# Patient Record
Sex: Male | Born: 1980 | Race: Black or African American | Hispanic: No | Marital: Single | Smoking: Current every day smoker
Health system: Southern US, Community
[De-identification: ages and names within clinical notes are randomized; demographics above are authoritative.]

## PROBLEM LIST (undated history)

## (undated) DIAGNOSIS — J45909 Unspecified asthma, uncomplicated: Secondary | ICD-10-CM

---
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
## 2007-03-26 ENCOUNTER — Emergency Department (HOSPITAL_COMMUNITY): Admission: EM | Admit: 2007-03-26 | Discharge: 2007-03-26 | Payer: Self-pay | Admitting: Emergency Medicine

## 2007-10-23 ENCOUNTER — Emergency Department (HOSPITAL_COMMUNITY): Admission: EM | Admit: 2007-10-23 | Discharge: 2007-10-23 | Payer: Self-pay | Admitting: Family Medicine

## 2009-12-29 ENCOUNTER — Emergency Department (HOSPITAL_COMMUNITY): Admission: EM | Admit: 2009-12-29 | Discharge: 2009-12-29 | Payer: Self-pay | Admitting: Family Medicine

## 2010-10-17 LAB — GC/CHLAMYDIA PROBE AMP, GENITAL
Chlamydia, DNA Probe: POSITIVE — AB
GC Probe Amp, Genital: POSITIVE — AB

## 2011-04-13 ENCOUNTER — Emergency Department (HOSPITAL_BASED_OUTPATIENT_CLINIC_OR_DEPARTMENT_OTHER)
Admission: EM | Admit: 2011-04-13 | Discharge: 2011-04-14 | Disposition: A | Discharge status: Home / Self Care | Payer: No Typology Code available for payment source | Attending: Emergency Medicine | Admitting: Emergency Medicine

## 2011-04-13 ENCOUNTER — Encounter: Payer: Self-pay | Admitting: *Deleted

## 2011-04-13 DIAGNOSIS — IMO0002 Reserved for concepts with insufficient information to code with codable children: Secondary | ICD-10-CM | POA: Insufficient documentation

## 2011-04-13 DIAGNOSIS — Y92410 Unspecified street and highway as the place of occurrence of the external cause: Secondary | ICD-10-CM | POA: Insufficient documentation

## 2011-04-13 DIAGNOSIS — F17200 Nicotine dependence, unspecified, uncomplicated: Secondary | ICD-10-CM | POA: Insufficient documentation

## 2011-04-13 DIAGNOSIS — S39012A Strain of muscle, fascia and tendon of lower back, initial encounter: Secondary | ICD-10-CM
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
## 2011-04-14 MED ORDER — HYDROCODONE-ACETAMINOPHEN 5-325 MG PO TABS: 1.0000 | ORAL_TABLET | Freq: Four times a day (QID) | ORAL | Status: AC | PRN

## 2011-04-14 MED ORDER — HYDROCODONE-ACETAMINOPHEN 5-325 MG PO TABS
1.0000 | ORAL_TABLET | Freq: Once | ORAL | Status: AC
  Administered 2011-04-14: 1 via ORAL
  Filled 2011-04-14: qty 1

## 2013-03-01 ENCOUNTER — Emergency Department (HOSPITAL_COMMUNITY): Payer: Self-pay

## 2013-03-01 ENCOUNTER — Encounter (HOSPITAL_COMMUNITY): Payer: Self-pay | Admitting: Emergency Medicine

## 2013-03-01 ENCOUNTER — Emergency Department (HOSPITAL_COMMUNITY)
Admission: EM | Admit: 2013-03-01 | Discharge: 2013-03-01 | Disposition: A | Discharge status: Home / Self Care | Payer: Self-pay | Attending: Emergency Medicine | Admitting: Emergency Medicine

## 2013-03-01 DIAGNOSIS — J45909 Unspecified asthma, uncomplicated: Secondary | ICD-10-CM | POA: Insufficient documentation

## 2013-03-01 DIAGNOSIS — F17200 Nicotine dependence, unspecified, uncomplicated: Secondary | ICD-10-CM | POA: Insufficient documentation

## 2013-03-01 DIAGNOSIS — W108XXA Fall (on) (from) other stairs and steps, initial encounter: Secondary | ICD-10-CM | POA: Insufficient documentation

## 2013-03-01 DIAGNOSIS — S93401A Sprain of unspecified ligament of right ankle, initial encounter: Secondary | ICD-10-CM

## 2013-03-01 DIAGNOSIS — Y92009 Unspecified place in unspecified non-institutional (private) residence as the place of occurrence of the external cause: Secondary | ICD-10-CM | POA: Insufficient documentation

## 2013-03-01 DIAGNOSIS — S93409A Sprain of unspecified ligament of unspecified ankle, initial encounter: Secondary | ICD-10-CM | POA: Insufficient documentation

## 2013-03-01 DIAGNOSIS — Y939 Activity, unspecified: Secondary | ICD-10-CM | POA: Insufficient documentation

## 2013-03-01 HISTORY — DX: Unspecified asthma, uncomplicated: J45.909

## 2013-03-01 MED ORDER — IBUPROFEN 200 MG PO TABS
600.0000 mg | ORAL_TABLET | Freq: Once | ORAL | Status: DC
  Filled 2013-03-01: qty 3

## 2013-03-01 NOTE — Progress Notes (Signed)
Orthopedic Tech Progress Note Patient Details:  Zachary Hart [birth year] [id number] CAM Walker applied Ortho Devices Type of Ortho Device: CAM walker Ortho Device/Splint Interventions: Application   Asia R Thompson 03/01/2013, 12:44 PM

## 2013-03-01 NOTE — ED Notes (Signed)
Patient returned from X-ray 

## 2013-03-01 NOTE — ED Provider Notes (Signed)
Medical screening examination/treatment/procedure(s) were performed by non-physician practitioner and as supervising physician I was immediately available for consultation/collaboration.   Charles B. Bernette Mayers, MD 03/01/13 [phone]

## 2013-03-01 NOTE — ED Provider Notes (Signed)
CSN: [id number]     Arrival date & time 03/01/13  4098 History     First MD Initiated Contact with Patient 03/01/13 [phone]     Chief Complaint  Patient presents with  . Fall  . Leg Injury   (Consider location/radiation/quality/duration/timing/severity/associated sxs/prior Treatment) HPI Comments: 32 year old male presents the emergency department complaining of right ankle pain after falling down the steps outside his house this morning after tripping about one or prior to arrival. Denies hitting his head or loss of consciousness. Describes the pain as sharp and achy, rated 7/10. He has not had any alleviating factors. Pain exacerbated by pressure or walking. Denies swelling, bruising, numbness or tingling. Also states he missed a step about 3-4 weeks ago and twisted his L ankle and is still having mild pain in it. He did not seek treatment.  Patient is a 32 y.o. male presenting with fall. The history is provided by the patient.  Fall Pertinent negatives include no joint swelling.    Past Medical History  Diagnosis Date  . Asthma    History reviewed. No pertinent past surgical history. No family history on file. History  Substance Use Topics  . Smoking status: Current Every Day Smoker -- 0.50 packs/day  . Smokeless tobacco: Not on file  . Alcohol Use: Yes     Comment: occasional    Review of Systems  Musculoskeletal: Negative for joint swelling.       Positive for R ankle pain.  Skin: Negative for color change.  All other systems reviewed and are negative.    Allergies  Percocet  Home Medications  No current outpatient prescriptions on file. BP 132/75  Pulse 71  Temp(Src) 98.1 F (36.7 C) (Oral)  Resp 18  Ht 5\' 6"  (1.676 m)  Wt 180 lb (81.647 kg)  BMI 29.07 kg/m2  SpO2 98% Physical Exam  Nursing note and vitals reviewed. Constitutional: He is oriented to person, place, and time. He appears well-developed and well-nourished. No distress.  HENT:  Head:  Normocephalic and atraumatic.  Mouth/Throat: Oropharynx is clear and moist.  Eyes: Conjunctivae are normal.  Neck: Normal range of motion. Neck supple.  Cardiovascular: Normal rate, regular rhythm, normal heart sounds and intact distal pulses.   Pulses:      Dorsalis pedis pulses are 2+ on the right side, and 2+ on the left side.       Posterior tibial pulses are 2+ on the right side, and 2+ on the left side.  Pulmonary/Chest: Effort normal and breath sounds normal.  Musculoskeletal:  TTP of lateral aspect of R ankle. No edema, deformity, bruising. Full ROM, pain reported in all directions. Achilles tendon intact. L ankle non-tender, full ROM, no edema, deformity, bruising.  Neurological: He is alert and oriented to person, place, and time. No sensory deficit.  Skin: Skin is warm and dry. He is not diaphoretic.  Psychiatric: His affect is blunt.    ED Course   Procedures (including critical care time)  Labs Reviewed - No data to display Dg Ankle Complete Right  03/01/2013   *RADIOLOGY REPORT*  Clinical Data: Fall, right ankle pain.  RIGHT ANKLE - COMPLETE 3+ VIEW  Comparison: None.  Findings: There is irregularity noted in the distal tibia medially. This could represent an osteochondral injury.  No bowel other acute bony abnormalities seen.  Soft tissues are intact.  IMPRESSION: Question osteochondral injury in the medial distal tibia along the medial ankle mortise.  Consider further evaluation with CT if felt clinically  indicated.   Original Report Authenticated By: Charlett Nose, M.D.   1. Ankle sprain, right, initial encounter     MDM  Patient with ankle sprain/questionable osteochondral injury. He is in NAD and able to ambulate, with a limp. Discharge home with cam walker. Ibuprofen for pain. F/u with ortho.  Trevor Mace, PA-C 03/01/13 [phone]

## 2013-03-01 NOTE — ED Notes (Signed)
Patient transported to X-ray 

## 2013-03-01 NOTE — ED Notes (Signed)
Pt fell down his outside steps this morning, pt not sure how he fell. Pt c/o right ankle pain. Pt also fell about 3-4 weeks ago at someone elses house by missing a step, Pt continues to have pain to left ankle.

## 2013-04-27 ENCOUNTER — Inpatient Hospital Stay (HOSPITAL_COMMUNITY)
Admission: EM | Admit: 2013-04-27 | Discharge: 2013-04-29 | DRG: 471 | Disposition: A | Discharge status: Home / Self Care | Payer: Self-pay | Attending: Neurological Surgery | Admitting: Neurological Surgery

## 2013-04-27 ENCOUNTER — Encounter (HOSPITAL_COMMUNITY): Payer: Self-pay | Admitting: Emergency Medicine

## 2013-04-27 DIAGNOSIS — S13151A Dislocation of C4/C5 cervical vertebrae, initial encounter: Secondary | ICD-10-CM | POA: Diagnosis present

## 2013-04-27 DIAGNOSIS — Y9389 Activity, other specified: Secondary | ICD-10-CM

## 2013-04-27 DIAGNOSIS — F17200 Nicotine dependence, unspecified, uncomplicated: Secondary | ICD-10-CM | POA: Diagnosis present

## 2013-04-27 DIAGNOSIS — M5000 Cervical disc disorder with myelopathy, unspecified cervical region: Secondary | ICD-10-CM | POA: Diagnosis present

## 2013-04-27 DIAGNOSIS — W1809XA Striking against other object with subsequent fall, initial encounter: Secondary | ICD-10-CM | POA: Diagnosis present

## 2013-04-27 DIAGNOSIS — S14125A Central cord syndrome at C5 level of cervical spinal cord, initial encounter: Secondary | ICD-10-CM

## 2013-04-27 DIAGNOSIS — S14121A Central cord syndrome at C1 level of cervical spinal cord, initial encounter: Secondary | ICD-10-CM | POA: Diagnosis present

## 2013-04-27 DIAGNOSIS — M4712 Other spondylosis with myelopathy, cervical region: Principal | ICD-10-CM | POA: Diagnosis present

## 2013-04-27 DIAGNOSIS — J45909 Unspecified asthma, uncomplicated: Secondary | ICD-10-CM | POA: Diagnosis present

## 2013-04-27 DIAGNOSIS — IMO0002 Reserved for concepts with insufficient information to code with codable children: Secondary | ICD-10-CM

## 2013-04-27 NOTE — ED Notes (Signed)
Per Manus Rudd PA pt placed in rigid aspen collar

## 2013-04-27 NOTE — ED Provider Notes (Signed)
11:54 PM discussed with radiologist, will call in an MRI team. Patient is tender lower C-spine and upper T-spine. Radiologist recommends order MR C-spine which will cover the upper T-spine  Discussed with trauma surgeon on-call, Dr. Magnus Ivan, given isolated injury and occurred yesterday he does not feel this requires level I trauma activation.  Aspen c-collar in place. On exam has weak grip strengths, biceps and triceps strengths with sensorium to light touch intact throughout upper extremities and subjective decreased sensation.  No neuro deficits otherwise.  1:37 AM preliminary MR results discussed with radiologist, has hematoma/cord contusion to C4-5 down to C6. Neurosurgery consulted.   2:34 AM d/w DR Danielle Dess, will admit Neuro ICU. IV decadron.  Sunnie Nielsen, MD 04/28/13 [phone]

## 2013-04-27 NOTE — ED Notes (Signed)
Pt states numbness to L hand and L back. Soreness to R ankle. Pt has weak grip to L hand.
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
## 2013-04-28 ENCOUNTER — Encounter (HOSPITAL_COMMUNITY): Payer: Self-pay | Admitting: Radiology

## 2013-04-28 ENCOUNTER — Encounter (HOSPITAL_COMMUNITY)
Admission: EM | Disposition: A | Discharge status: Home / Self Care | Payer: Self-pay | Source: Home / Self Care | Attending: Neurological Surgery

## 2013-04-28 ENCOUNTER — Emergency Department (HOSPITAL_COMMUNITY): Payer: Self-pay

## 2013-04-28 ENCOUNTER — Inpatient Hospital Stay (HOSPITAL_COMMUNITY): Payer: No Typology Code available for payment source

## 2013-04-28 ENCOUNTER — Inpatient Hospital Stay (HOSPITAL_COMMUNITY): Payer: Self-pay | Admitting: Certified Registered"

## 2013-04-28 ENCOUNTER — Encounter (HOSPITAL_COMMUNITY): Payer: Self-pay | Admitting: Certified Registered"

## 2013-04-28 HISTORY — PX: ANTERIOR CERVICAL DECOMP/DISCECTOMY FUSION: SHX1161

## 2013-04-28 LAB — MRSA PCR SCREENING: MRSA by PCR: NEGATIVE

## 2013-04-28 LAB — POCT I-STAT, CHEM 8
BUN: 10 mg/dL (ref 6–23)
Calcium, Ion: 1.16 mmol/L (ref 1.12–1.23)
Creatinine, Ser: 1.4 mg/dL — ABNORMAL HIGH (ref 0.50–1.35)
Glucose, Bld: 95 mg/dL (ref 70–99)
Hemoglobin: 15 g/dL (ref 13.0–17.0)
Sodium: 141 mEq/L (ref 135–145)
TCO2: 22 mmol/L (ref 0–100)

## 2013-04-28 LAB — CBC
HCT: 40.2 % (ref 39.0–52.0)
Hemoglobin: 13.5 g/dL (ref 13.0–17.0)
MCH: 30.2 pg (ref 26.0–34.0)
MCHC: 33.6 g/dL (ref 30.0–36.0)
MCV: 89.9 fL (ref 78.0–100.0)
RBC: 4.47 MIL/uL (ref 4.22–5.81)

## 2013-04-28 SURGERY — ANTERIOR CERVICAL DECOMPRESSION/DISCECTOMY FUSION 1 LEVEL
Anesthesia: General | Site: Neck | Wound class: Clean

## 2013-04-28 MED ORDER — ONDANSETRON HCL 4 MG/2ML IJ SOLN
4.0000 mg | Freq: Once | INTRAMUSCULAR | Status: AC
  Administered 2013-04-28: 4 mg via INTRAVENOUS
  Filled 2013-04-28: qty 2

## 2013-04-28 MED ORDER — LACTATED RINGERS IV SOLN
INTRAVENOUS | Status: DC | PRN
  Administered 2013-04-28 (×2): via INTRAVENOUS

## 2013-04-28 MED ORDER — LIDOCAINE HCL (CARDIAC) 20 MG/ML IV SOLN
INTRAVENOUS | Status: DC | PRN
  Administered 2013-04-28: 40 mg via INTRAVENOUS

## 2013-04-28 MED ORDER — SODIUM CHLORIDE 0.9 % IV SOLN: 250.0000 mL | INTRAVENOUS | Status: DC

## 2013-04-28 MED ORDER — FENTANYL CITRATE 0.05 MG/ML IJ SOLN
INTRAMUSCULAR | Status: DC | PRN
  Administered 2013-04-28: 50 ug via INTRAVENOUS
  Administered 2013-04-28 (×2): 100 ug via INTRAVENOUS
  Administered 2013-04-28: 50 ug via INTRAVENOUS
  Administered 2013-04-28: 150 ug via INTRAVENOUS

## 2013-04-28 MED ORDER — MIDAZOLAM HCL 2 MG/2ML IJ SOLN: 0.5000 mg | Freq: Once | INTRAMUSCULAR | Status: DC | PRN

## 2013-04-28 MED ORDER — DOCUSATE SODIUM 100 MG PO CAPS
100.0000 mg | ORAL_CAPSULE | Freq: Two times a day (BID) | ORAL | Status: DC
  Administered 2013-04-28: 100 mg via ORAL
  Filled 2013-04-28 (×2): qty 1

## 2013-04-28 MED ORDER — DEXAMETHASONE SODIUM PHOSPHATE 4 MG/ML IJ SOLN
10.0000 mg | Freq: Once | INTRAMUSCULAR | Status: AC
  Administered 2013-04-28: 10 mg via INTRAVENOUS
  Filled 2013-04-28: qty 3

## 2013-04-28 MED ORDER — ACETAMINOPHEN 650 MG RE SUPP: 650.0000 mg | RECTAL | Status: DC | PRN

## 2013-04-28 MED ORDER — ACETAMINOPHEN 325 MG PO TABS: 650.0000 mg | ORAL_TABLET | ORAL | Status: DC | PRN

## 2013-04-28 MED ORDER — POLYETHYLENE GLYCOL 3350 17 G PO PACK
17.0000 g | PACK | Freq: Every day | ORAL | Status: DC | PRN
  Filled 2013-04-28: qty 1

## 2013-04-28 MED ORDER — CEFAZOLIN SODIUM-DEXTROSE 2-3 GM-% IV SOLR
2.0000 g | Freq: Once | INTRAVENOUS | Status: DC
  Administered 2013-04-28: 2 g via INTRAVENOUS

## 2013-04-28 MED ORDER — ONDANSETRON HCL 4 MG/2ML IJ SOLN: 4.0000 mg | Freq: Four times a day (QID) | INTRAMUSCULAR | Status: DC | PRN

## 2013-04-28 MED ORDER — OXYCODONE HCL 5 MG PO TABS: 5.0000 mg | ORAL_TABLET | Freq: Once | ORAL | Status: DC | PRN

## 2013-04-28 MED ORDER — ALBUTEROL SULFATE HFA 108 (90 BASE) MCG/ACT IN AERS
INHALATION_SPRAY | RESPIRATORY_TRACT | Status: DC | PRN
  Administered 2013-04-28: 2 via RESPIRATORY_TRACT

## 2013-04-28 MED ORDER — ONDANSETRON HCL 4 MG/2ML IJ SOLN
INTRAMUSCULAR | Status: DC | PRN
  Administered 2013-04-28: 4 mg via INTRAVENOUS

## 2013-04-28 MED ORDER — PHENOL 1.4 % MT LIQD: 1.0000 | OROMUCOSAL | Status: DC | PRN

## 2013-04-28 MED ORDER — BISACODYL 10 MG RE SUPP: 10.0000 mg | Freq: Every day | RECTAL | Status: DC | PRN

## 2013-04-28 MED ORDER — FENTANYL CITRATE 0.05 MG/ML IJ SOLN
50.0000 ug | INTRAMUSCULAR | Status: DC | PRN
  Administered 2013-04-28: 50 ug via INTRAVENOUS
  Filled 2013-04-28: qty 2

## 2013-04-28 MED ORDER — ONDANSETRON HCL 4 MG/2ML IJ SOLN: 4.0000 mg | INTRAMUSCULAR | Status: DC | PRN

## 2013-04-28 MED ORDER — FLEET ENEMA 7-19 GM/118ML RE ENEM: 1.0000 | ENEMA | Freq: Once | RECTAL | Status: AC | PRN

## 2013-04-28 MED ORDER — MORPHINE SULFATE 2 MG/ML IJ SOLN
1.0000 mg | INTRAMUSCULAR | Status: DC | PRN
  Administered 2013-04-29: 4 mg via INTRAVENOUS
  Administered 2013-04-29: 2 mg via INTRAVENOUS
  Filled 2013-04-28: qty 2
  Filled 2013-04-28: qty 1

## 2013-04-28 MED ORDER — MIDAZOLAM HCL 5 MG/5ML IJ SOLN
INTRAMUSCULAR | Status: DC | PRN
  Administered 2013-04-28: 1 mg via INTRAVENOUS

## 2013-04-28 MED ORDER — SENNA 8.6 MG PO TABS
1.0000 | ORAL_TABLET | Freq: Two times a day (BID) | ORAL | Status: DC
Start: 2013-04-28 — End: 2013-04-29
  Administered 2013-04-28: 8.6 mg via ORAL
  Filled 2013-04-28 (×2): qty 1

## 2013-04-28 MED ORDER — 0.9 % SODIUM CHLORIDE (POUR BTL) OPTIME
TOPICAL | Status: DC | PRN
  Administered 2013-04-28: 1000 mL

## 2013-04-28 MED ORDER — SODIUM CHLORIDE 0.9 % IJ SOLN
3.0000 mL | Freq: Two times a day (BID) | INTRAMUSCULAR | Status: DC
  Administered 2013-04-28: 3 mL via INTRAVENOUS

## 2013-04-28 MED ORDER — PROPOFOL 10 MG/ML IV BOLUS
INTRAVENOUS | Status: DC | PRN
  Administered 2013-04-28: 20 mg via INTRAVENOUS
  Administered 2013-04-28: 200 mg via INTRAVENOUS

## 2013-04-28 MED ORDER — LIDOCAINE-EPINEPHRINE 1 %-1:100000 IJ SOLN
INTRAMUSCULAR | Status: DC | PRN
  Administered 2013-04-28: 5 mL

## 2013-04-28 MED ORDER — PHENYLEPHRINE HCL 10 MG/ML IJ SOLN
INTRAMUSCULAR | Status: DC | PRN
  Administered 2013-04-28: 40 ug via INTRAVENOUS
  Administered 2013-04-28: 80 ug via INTRAVENOUS

## 2013-04-28 MED ORDER — PROMETHAZINE HCL 25 MG/ML IJ SOLN: 6.2500 mg | INTRAMUSCULAR | Status: DC | PRN

## 2013-04-28 MED ORDER — MENTHOL 3 MG MT LOZG: 1.0000 | LOZENGE | OROMUCOSAL | Status: DC | PRN

## 2013-04-28 MED ORDER — SODIUM CHLORIDE 0.9 % IJ SOLN
3.0000 mL | Freq: Two times a day (BID) | INTRAMUSCULAR | Status: DC
  Administered 2013-04-28 – 2013-04-29 (×2): 3 mL via INTRAVENOUS

## 2013-04-28 MED ORDER — HEMOSTATIC AGENTS (NO CHARGE) OPTIME
TOPICAL | Status: DC | PRN
  Administered 2013-04-28: 1 via TOPICAL

## 2013-04-28 MED ORDER — THROMBIN 5000 UNITS EX SOLR
CUTANEOUS | Status: DC | PRN
  Administered 2013-04-28 (×2): 5000 [IU] via TOPICAL

## 2013-04-28 MED ORDER — HYDROMORPHONE HCL PF 1 MG/ML IJ SOLN: 0.2500 mg | INTRAMUSCULAR | Status: DC | PRN

## 2013-04-28 MED ORDER — METHOCARBAMOL 100 MG/ML IJ SOLN
500.0000 mg | Freq: Four times a day (QID) | INTRAMUSCULAR | Status: DC | PRN
  Filled 2013-04-28: qty 5

## 2013-04-28 MED ORDER — CEFAZOLIN SODIUM-DEXTROSE 2-3 GM-% IV SOLR
INTRAVENOUS | Status: AC
  Administered 2013-04-28: 2 g via INTRAVENOUS
  Filled 2013-04-28: qty 50

## 2013-04-28 MED ORDER — DEXAMETHASONE SODIUM PHOSPHATE 4 MG/ML IJ SOLN
INTRAMUSCULAR | Status: DC | PRN
  Administered 2013-04-28: 10 mg via INTRAVENOUS

## 2013-04-28 MED ORDER — ALUM & MAG HYDROXIDE-SIMETH 200-200-20 MG/5ML PO SUSP: 30.0000 mL | Freq: Four times a day (QID) | ORAL | Status: DC | PRN

## 2013-04-28 MED ORDER — DOCUSATE SODIUM 100 MG PO CAPS
100.0000 mg | ORAL_CAPSULE | Freq: Two times a day (BID) | ORAL | Status: DC
  Filled 2013-04-28 (×2): qty 1

## 2013-04-28 MED ORDER — NEOSTIGMINE METHYLSULFATE 1 MG/ML IJ SOLN
INTRAMUSCULAR | Status: DC | PRN
  Administered 2013-04-28: 4 mg via INTRAVENOUS

## 2013-04-28 MED ORDER — MEPERIDINE HCL 25 MG/ML IJ SOLN: 6.2500 mg | INTRAMUSCULAR | Status: DC | PRN

## 2013-04-28 MED ORDER — SODIUM CHLORIDE 0.9 % IR SOLN
Status: DC | PRN
  Administered 2013-04-28: 15:00:00

## 2013-04-28 MED ORDER — DEXTROSE 5 % IV SOLN
INTRAVENOUS | Status: DC | PRN
  Administered 2013-04-28: 14:00:00 via INTRAVENOUS

## 2013-04-28 MED ORDER — OXYCODONE HCL 5 MG/5ML PO SOLN: 5.0000 mg | Freq: Once | ORAL | Status: DC | PRN

## 2013-04-28 MED ORDER — POTASSIUM CHLORIDE IN NACL 20-0.9 MEQ/L-% IV SOLN
INTRAVENOUS | Status: DC
  Administered 2013-04-28: 04:00:00 via INTRAVENOUS
  Filled 2013-04-28 (×6): qty 1000

## 2013-04-28 MED ORDER — MAGNESIUM CITRATE PO SOLN
1.0000 | Freq: Once | ORAL | Status: AC | PRN
  Filled 2013-04-28: qty 296

## 2013-04-28 MED ORDER — HYDROMORPHONE HCL PF 1 MG/ML IJ SOLN
0.5000 mg | INTRAMUSCULAR | Status: DC | PRN
  Administered 2013-04-28 (×3): 0.5 mg via INTRAVENOUS
  Filled 2013-04-28 (×3): qty 1

## 2013-04-28 MED ORDER — ONDANSETRON HCL 4 MG PO TABS: 4.0000 mg | ORAL_TABLET | Freq: Four times a day (QID) | ORAL | Status: DC | PRN

## 2013-04-28 MED ORDER — ROCURONIUM BROMIDE 100 MG/10ML IV SOLN
INTRAVENOUS | Status: DC | PRN
  Administered 2013-04-28: 50 mg via INTRAVENOUS

## 2013-04-28 MED ORDER — BUPIVACAINE HCL (PF) 0.5 % IJ SOLN
INTRAMUSCULAR | Status: DC | PRN
  Administered 2013-04-28: 5 mL

## 2013-04-28 MED ORDER — GLYCOPYRROLATE 0.2 MG/ML IJ SOLN
INTRAMUSCULAR | Status: DC | PRN
  Administered 2013-04-28: 0.6 mg via INTRAVENOUS

## 2013-04-28 MED ORDER — SODIUM CHLORIDE 0.9 % IJ SOLN: 3.0000 mL | INTRAMUSCULAR | Status: DC | PRN

## 2013-04-28 MED ORDER — DEXAMETHASONE SODIUM PHOSPHATE 4 MG/ML IJ SOLN
8.0000 mg | Freq: Four times a day (QID) | INTRAMUSCULAR | Status: DC
  Administered 2013-04-28 – 2013-04-29 (×7): 8 mg via INTRAVENOUS
  Filled 2013-04-28 (×14): qty 2

## 2013-04-28 MED ORDER — SENNA 8.6 MG PO TABS
1.0000 | ORAL_TABLET | Freq: Two times a day (BID) | ORAL | Status: DC
  Filled 2013-04-28 (×2): qty 1

## 2013-04-28 MED ORDER — METHOCARBAMOL 500 MG PO TABS
500.0000 mg | ORAL_TABLET | Freq: Four times a day (QID) | ORAL | Status: DC | PRN
  Administered 2013-04-28: 500 mg via ORAL
  Filled 2013-04-28: qty 1

## 2013-04-28 SURGICAL SUPPLY — 56 items
BAG DECANTER FOR FLEXI CONT (MISCELLANEOUS) ×2 IMPLANT
BANDAGE GAUZE ELAST BULKY 4 IN (GAUZE/BANDAGES/DRESSINGS) IMPLANT
BIT DRILL 14MM (INSTRUMENTS) ×1 IMPLANT
BIT DRILL NEURO 2X3.1 SFT TUCH (MISCELLANEOUS) ×1 IMPLANT
BUR BARREL STRAIGHT FLUTE 4.0 (BURR) ×2 IMPLANT
CANISTER SUCTION 2500CC (MISCELLANEOUS) ×2 IMPLANT
CLOTH BEACON ORANGE TIMEOUT ST (SAFETY) ×2 IMPLANT
CONT SPEC 4OZ CLIKSEAL STRL BL (MISCELLANEOUS) ×4 IMPLANT
DECANTER SPIKE VIAL GLASS SM (MISCELLANEOUS) ×2 IMPLANT
DERMABOND ADHESIVE PROPEN (GAUZE/BANDAGES/DRESSINGS) ×1
DERMABOND ADVANCED (GAUZE/BANDAGES/DRESSINGS) ×1
DERMABOND ADVANCED .7 DNX12 (GAUZE/BANDAGES/DRESSINGS) ×1 IMPLANT
DERMABOND ADVANCED .7 DNX6 (GAUZE/BANDAGES/DRESSINGS) ×1 IMPLANT
DRAPE LAPAROTOMY 100X72 PEDS (DRAPES) ×2 IMPLANT
DRAPE MICROSCOPE LEICA (MISCELLANEOUS) IMPLANT
DRAPE POUCH INSTRU U-SHP 10X18 (DRAPES) ×2 IMPLANT
DRESSING TELFA 8X3 (GAUZE/BANDAGES/DRESSINGS) ×2 IMPLANT
DRILL 14MM (INSTRUMENTS) ×2
DRILL NEURO 2X3.1 SOFT TOUCH (MISCELLANEOUS) ×2
DRSG OPSITE 4X5.5 SM (GAUZE/BANDAGES/DRESSINGS) ×2 IMPLANT
DURAPREP 6ML APPLICATOR 50/CS (WOUND CARE) ×2 IMPLANT
ELECT REM PT RETURN 9FT ADLT (ELECTROSURGICAL) ×2
ELECTRODE REM PT RTRN 9FT ADLT (ELECTROSURGICAL) ×1 IMPLANT
GAUZE SPONGE 4X4 16PLY XRAY LF (GAUZE/BANDAGES/DRESSINGS) IMPLANT
GLOVE BIO SURGEON STRL SZ7.5 (GLOVE) IMPLANT
GLOVE BIOGEL PI IND STRL 7.5 (GLOVE) IMPLANT
GLOVE BIOGEL PI IND STRL 8.5 (GLOVE) ×1 IMPLANT
GLOVE BIOGEL PI INDICATOR 7.5 (GLOVE)
GLOVE BIOGEL PI INDICATOR 8.5 (GLOVE) ×1
GLOVE ECLIPSE 8.5 STRL (GLOVE) ×2 IMPLANT
GLOVE EXAM NITRILE LRG STRL (GLOVE) IMPLANT
GLOVE EXAM NITRILE MD LF STRL (GLOVE) IMPLANT
GLOVE EXAM NITRILE XL STR (GLOVE) IMPLANT
GLOVE EXAM NITRILE XS STR PU (GLOVE) IMPLANT
GOWN BRE IMP SLV AUR LG STRL (GOWN DISPOSABLE) IMPLANT
GOWN BRE IMP SLV AUR XL STRL (GOWN DISPOSABLE) ×2 IMPLANT
GOWN STRL REIN 2XL LVL4 (GOWN DISPOSABLE) ×2 IMPLANT
HEAD HALTER (SOFTGOODS) ×2 IMPLANT
KIT BASIN OR (CUSTOM PROCEDURE TRAY) ×2 IMPLANT
KIT ROOM TURNOVER OR (KITS) ×2 IMPLANT
NEEDLE HYPO 22GX1.5 SAFETY (NEEDLE) ×2 IMPLANT
NEEDLE SPNL 22GX3.5 QUINCKE BK (NEEDLE) ×2 IMPLANT
NS IRRIG 1000ML POUR BTL (IV SOLUTION) ×2 IMPLANT
PACK LAMINECTOMY NEURO (CUSTOM PROCEDURE TRAY) ×2 IMPLANT
PAD ARMBOARD 7.5X6 YLW CONV (MISCELLANEOUS) ×6 IMPLANT
PLATE TRESTLE LUXE 12 1LVL (Plate) ×2 IMPLANT
RUBBERBAND STERILE (MISCELLANEOUS) IMPLANT
SCREW 14MM (Screw) ×2 IMPLANT
SPACER ASSEM CERV LORD 6M (Spacer) ×2 IMPLANT
SPONGE INTESTINAL PEANUT (DISPOSABLE) ×2 IMPLANT
SPONGE SURGIFOAM ABS GEL SZ50 (HEMOSTASIS) ×2 IMPLANT
SUT VIC AB 3-0 SH 8-18 (SUTURE) ×2 IMPLANT
SYR 20ML ECCENTRIC (SYRINGE) ×2 IMPLANT
TOWEL OR 17X24 6PK STRL BLUE (TOWEL DISPOSABLE) ×2 IMPLANT
TOWEL OR 17X26 10 PK STRL BLUE (TOWEL DISPOSABLE) ×2 IMPLANT
WATER STERILE IRR 1000ML POUR (IV SOLUTION) ×2 IMPLANT

## 2013-04-28 NOTE — ED Provider Notes (Signed)
Medical screening examination/treatment/procedure(s) were conducted as a shared visit with non-physician practitioner(s) and myself.  I personally evaluated the patient during the encounter  Sunnie Nielsen, MD 04/28/13 [phone]

## 2013-04-28 NOTE — Anesthesia Procedure Notes (Signed)
Procedure Name: Intubation Date/Time: 04/28/2013 2:18 PM Performed by: Darcey Nora B Pre-anesthesia Checklist: Patient identified, Patient being monitored, Emergency Drugs available and Suction available Patient Re-evaluated:Patient Re-evaluated prior to inductionOxygen Delivery Method: Circle system utilized Preoxygenation: Pre-oxygenation with 100% oxygen Intubation Type: IV induction Ventilation: Mask ventilation without difficulty Laryngoscope Size: Mac and 4 ((Glidescope)) Grade View: Grade II Tube type: Oral Tube size: 8.0 mm Number of attempts: 1 Airway Equipment and Method: Video-laryngoscopy and Stylet Placement Confirmation: ETT inserted through vocal cords under direct vision,  breath sounds checked- equal and bilateral and positive ETCO2 Secured at: 24 (cm at teeth) cm Tube secured with: Tape Dental Injury: Teeth and Oropharynx as per pre-operative assessment

## 2013-04-28 NOTE — ED Provider Notes (Signed)
Medical screening examination/treatment/procedure(s) were conducted as a shared visit with non-physician practitioner(s) and myself.  I personally evaluated the patient during the encounter  CRITICAL CARE Performed by: Sunnie Nielsen Total critical care time: 40 Critical care time was exclusive of separately billable procedures and treating other patients. Critical care was necessary to treat or prevent imminent or life-threatening deterioration. Critical care was time spent personally by me on the following activities: development of treatment plan with patient and/or surrogate as well as nursing, discussions with consultants, evaluation of patient's response to treatment, examination of patient, obtaining history from patient or surrogate, ordering and performing treatments and interventions, ordering and review of laboratory studies, ordering and review of radiographic studies, pulse oximetry and re-evaluation of patient's condition. IV steroids. NSG consult and admit Neuro ICU Aspen C collar and C spinr precautions.    Sunnie Nielsen, MD 04/28/13 [phone]

## 2013-04-28 NOTE — ED Notes (Signed)
Pt resting comfortably, sleeping. Pt woken up and informed of transport. Reports a decrease in pain.

## 2013-04-28 NOTE — Anesthesia Preprocedure Evaluation (Addendum)
Anesthesia Evaluation  Patient identified by MRN, date of birth, ID band Patient awake    Reviewed: Allergy & Precautions, H&P , NPO status , Patient's Chart, lab work & pertinent test results, reviewed documented beta blocker date and time   History of Anesthesia Complications Negative for: history of anesthetic complications  Airway Mallampati: I TM Distance: >3 FB Neck ROM: Full    Dental  (+) Dental Advisory Given and Teeth Intact   Pulmonary asthma , Current Smoker,  Supposed to use inhaler... breath sounds clear to auscultation  Pulmonary exam normal       Cardiovascular negative cardio ROS  Rhythm:Regular Rate:Normal     Neuro/Psych Has cervical collar in place    GI/Hepatic Neg liver ROS, GERD-  Controlled,  Endo/Other  negative endocrine ROS  Renal/GU negative Renal ROS     Musculoskeletal   Abdominal   Peds  Hematology negative hematology ROS (+)   Anesthesia Other Findings   Reproductive/Obstetrics                          Anesthesia Physical Anesthesia Plan  ASA: II  Anesthesia Plan: General   Post-op Pain Management:    Induction: Intravenous  Airway Management Planned: Oral ETT and Video Laryngoscope Planned  Additional Equipment:   Intra-op Plan:   Post-operative Plan: Extubation in OR  Informed Consent: I have reviewed the patients History and Physical, chart, labs and discussed the procedure including the risks, benefits and alternatives for the proposed anesthesia with the patient or authorized representative who has indicated his/her understanding and acceptance.   Dental advisory given  Plan Discussed with: CRNA and Surgeon  Anesthesia Plan Comments: (Plan routine monitors, GETA with VideoGlide intubation in C-collar)        Anesthesia Quick Evaluation

## 2013-04-28 NOTE — Transfer of Care (Signed)
Immediate Anesthesia Transfer of Care Note  Patient: Zachary Hart  Procedure(s) Performed: Procedure(s): CERVICAL FOUR-FIVE ANTERIOR CERVICAL DECOMPRESSION/DISCECTOMY FUSION 1 LEVEL (N/A)  Patient Location: PACU  Anesthesia Type:General  Level of Consciousness: awake, alert , oriented and patient cooperative  Airway & Oxygen Therapy: Patient Spontanous Breathing and Patient connected to nasal cannula oxygen  Post-op Assessment: Report given to PACU RN, Post -op Vital signs reviewed and stable, Patient moving all extremities and writhing in pain of "burning in back of neck and needing to "pee"  Post vital signs: Reviewed and stable  Complications: No apparent anesthesia complications
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
## 2013-04-28 NOTE — H&P (Signed)
Zachary Hart is an 32 y.o. male.   Chief Complaint: Arm pain weakness numbness and tingling status post neck injury HPI: Patient is a 32 year old right-handed male who was apparently doing a back flip on Saturday when he landed on his head and his neck flexed abruptly. He notes that he was paralyzed on the ground for 10 minutes and gradually regained some function in his upper extremities in his lower extremities. He has however persisted with dysesthetic sensation in both his hands.  Past Medical History  Diagnosis Date  . Asthma     History reviewed. No pertinent past surgical history.  No family history on file. Social History:  reports that he has been smoking.  He does not have any smokeless tobacco history on file. He reports that  drinks alcohol. He reports that he does not use illicit drugs.  Allergies:  Allergies  Allergen Reactions  . Percocet [Oxycodone-Acetaminophen] Hives    No prescriptions prior to admission    Results for orders placed during the hospital encounter of 04/27/13 (from the past 48 hour(s))  CBC     Status: None   Collection Time    04/27/13 11:59 PM      Result Value Range   WBC 5.3  4.0 - 10.5 K/uL   RBC 4.47  4.22 - 5.81 MIL/uL   Hemoglobin 13.5  13.0 - 17.0 g/dL   HCT 16.1  09.6 - 04.5 %   MCV 89.9  78.0 - 100.0 fL   MCH 30.2  26.0 - 34.0 pg   MCHC 33.6  30.0 - 36.0 g/dL   RDW 40.9  81.1 - 91.4 %   Platelets 246  150 - 400 K/uL  POCT I-STAT, CHEM 8     Status: Abnormal   Collection Time    04/28/13 12:06 AM      Result Value Range   Sodium 141  135 - 145 mEq/L   Potassium 3.4 (*) 3.5 - 5.1 mEq/L   Chloride 105  96 - 112 mEq/L   BUN 10  6 - 23 mg/dL   Creatinine, Ser 7.82 (*) 0.50 - 1.35 mg/dL   Glucose, Bld 95  70 - 99 mg/dL   Calcium, Ion 9.56  2.13 - 1.23 mmol/L   TCO2 22  0 - 100 mmol/L   Hemoglobin 15.0  13.0 - 17.0 g/dL   HCT 08.6  57.8 - 46.9 %  MRSA PCR SCREENING     Status: None   Collection Time    04/28/13  4:16 AM       Result Value Range   MRSA by PCR NEGATIVE  NEGATIVE   Comment:            The GeneXpert MRSA Assay (FDA     approved for NASAL specimens     only), is one component of a     comprehensive MRSA colonization     surveillance program. It is not     intended to diagnose MRSA     infection nor to guide or     monitor treatment for     MRSA infections.   Ct Cervical Spine Wo Contrast  04/28/2013   CLINICAL DATA:  Neck injury.  Upper extremity weakness, numbness.  EXAM: CT CERVICAL SPINE WITHOUT CONTRAST  TECHNIQUE: Multidetector CT imaging of the cervical spine was performed without intravenous contrast. Multiplanar CT image reconstructions were also generated.  COMPARISON:  None.  FINDINGS: Normal alignment. No fracture. Prevertebral soft tissues are normal. No  epidural or paraspinal hematoma. Imaging into the upper thoracic spine to the superior endplate of T4 is unremarkable.  IMPRESSION: No acute bony abnormality.   Electronically Signed   By: Charlett Nose M.D.   On: 04/28/2013 00:29   Mr Cervical Spine Wo Contrast  04/28/2013   *RADIOLOGY REPORT*  Clinical Data: And bilateral lower extremities, status post recent injury  MRI CERVICAL SPINE WITHOUT CONTRAST  Technique:  Multiplanar and multiecho pulse sequences of the cervical spine, to include the craniocervical junction and cervicothoracic junction, were obtained according to standard protocol without intravenous contrast.  Comparison:  CT performed earlier on the same day.  Findings: There is straightening of the normal cervical lordosis. No listhesis is identified.  Signal intensity from the vertebral body bone marrow is within normal limits.  The ligamentous structures demonstrate a normal appearance and signal intensity without abnormal T2/STIR signal to suggest ligamentous injury.  No fracture is identified.  The visualized posterior fossa is within normal limits.  Diffuse congenital narrowing of the spine is present.  At C2-3, there is no  disc bulging disc protrusion.  Mild left facet arthrosis is present.  No canal or neural foraminal stenosis.  At C3-4, there is mild diffuse degenerative disc osteophyte that indenting upon the ventral thecal sac and partially effacing the ventral CSF.  There is mild canal stenosis with minimal encroachment upon the right neural foramen.  At C4-5, there is a large central/right paracentral disc bulge that effaces the ventral thecal sac and severely narrows the spinal canal.  There is flattening of the cervical spinal cord at this level of abnormal T2 signal intensity is seen within the intramedullary cervical spinal cord at the C4-5 through C5-6 levels (series 3, image 7), most consistent with cord contusion. The cord is slightly expanded at this level.  No definite hyperintense T1 signal intensity seen within this region to suggest hemorrhage. There is severe canal and right neural foraminal stenosis at the C4- 5 level with moderate left foraminal narrowing.  At C5-6, there is diffuse degenerative disc osteophyte with mild bilateral facet arthrosis.  Superimposed central disc protrusion indents the ventral thecal sac and largely effaces the ventral CSF resulting in moderate canal stenosis.  Again, abnormal T2 signal intensity seen within the cervical spine at this level, predominantly the central and left paracentral aspect of the cord (series 6, image 19).  There is mild bilateral foraminal narrowing.  At C6-7, there is mild degenerative disc osteophyte and bilateral facet arthrosis.  No canal or neural foraminal stenosis.  At C7 - T1, there is no neural foraminal stenosis.  IMPRESSION:  1.  Abnormal T2/STIR signal intensity within the intramedullary aspect of the cervical spinal cord extending from the inferior aspect of C4 through the superior aspect of C6, consistent with cord contusion.  No definite hemorrhagic cord injury identified. No evidence of fracture or ligamentous injury.  2.  Central/right  paracentral disc protrusion at C4-5 the with resultant severe canal and right foraminal stenosis.  3. Central disc protrusion at C5-6 with resultant moderate canal and mild bilateral foraminal stenosis.  4.  Additional mild multilevel degenerative disc disease as above.  Critical Value/emergent results were called by telephone at the time of interpretation on 09/ 29/2014 at 2:05 a.m. to Dr. Dierdre Highman, who verbally acknowledged these results.   Original Report Authenticated By: Rise Mu, M.D.    Review of Systems  HENT: Positive for neck pain.   Eyes: Negative.   Respiratory: Negative.   Cardiovascular: Negative.  Genitourinary: Negative.   Skin: Negative.   Neurological: Positive for sensory change, focal weakness and weakness.  Endo/Heme/Allergies: Negative.   Psychiatric/Behavioral: Negative.     Blood pressure 112/59, pulse 77, temperature 98.4 F (36.9 C), temperature source Oral, resp. rate 14, height 5\' 7"  (1.702 m), weight 77.6 kg (171 lb 1.2 oz), SpO2 99.00%. Physical Exam  Constitutional: He is oriented to person, place, and time. He appears well-developed and well-nourished.  HENT:  Head: Normocephalic and atraumatic.  Eyes: Conjunctivae and EOM are normal.  Neck:  Patient is in a hard cervical collar.  Cardiovascular: Normal rate and regular rhythm.   Respiratory: Effort normal and breath sounds normal.  GI: Soft. Bowel sounds are normal.  Musculoskeletal:  Deltoids 4/5 grips 4 minus out of 5 intrinsic strength 4/5 triceps strength 4/5 biceps strength is 4 minus out of 5 lower extremity strength 4/5 in iliopsoas quads tibialis anterior and gastrocs  Dysesthetic sensation noted in upper extremities and hands worse on the left than on the right  Neurological: He is alert and oriented to person, place, and time.  Skin: Skin is warm and dry.  Psychiatric: He has a normal mood and affect. His behavior is normal. Judgment and thought content normal.      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  Patient will be taken to the operating room later today to undergo anterior cervical decompression and arthrodesis at C4-C5 this will hopefully aid in the patient's gradual neurologic recovery.  Zana Biancardi J 04/28/2013, 12:08 PM

## 2013-04-28 NOTE — Preoperative (Signed)
Beta Blockers   Reason not to administer Beta Blockers:Not Applicable 

## 2013-04-28 NOTE — Progress Notes (Signed)
Charted in wrong time column. Should have been 1559

## 2013-04-28 NOTE — ED Notes (Signed)
Pt has been NPO since arrival to ED. Bed rest.

## 2013-04-28 NOTE — Op Note (Signed)
Date of surgery: 04/28/2013 Preoperative diagnosis: Cervical spondylosis with radiculopathy and myelopathy C4-C5, central cord injury Post operative diagnosis: Cervical spondylosis with radiculopathy and myelopathy C4-C5, central cord injury Procedure: Anterior cervical discectomy decompression of nerve roots and spinal canal C4-C5 arthrodesis with structural allograft, Alphatec plate fixation [id number] Surgeon: Barnett Abu M.D. Indications: Patient is a 32 year old male who was doing a back flip on Saturday night. He landed on his head with a flexion injury and he has disrupted the posterior longitudinal ligament at C4-C5 with a traumatically ruptured disc causing cord compression and a central cord syndrome. His been advised regarding the need for surgical decompression. He has weakness in his upper extremities in addition to severe dysesthetic pain. Procedure: The patient was brought to the operating room placed on the table in supine position. After the smooth induction of general endotracheal anesthesia neck was placed in 5 pounds of halter traction and prepped with alcohol and DuraPrep. After sterile draping and appropriate timeout procedure a transverse incision was created in the left side of the neck and carried down to the platysma. The plane between the sternocleidomastoid and strap muscles dissected bluntly until the prevertebral space was reached. The first identifiable disc space was noted to be C5-C6 on a localizing radiograph. The dissection was then undertaken in the longus coli muscle to allow placement of a self-retaining Caspar type retractor.  The anterior longitudinal ligament was opened at C4-C5 and ventral osteophytes were removed with a Leksell rongeur and Kerrison punch. Interspace was cleared of significant quantity of the degenerated disc material in the region of the posterior longitudinal ligament was removed. Dissection was carried out using a high-speed drill and 3-0 Karlin  curettes. Uncinate processes were drilled down and removed and osteophytes from the inferior margin of the body of C4 were removed with a Kerrison 2 mm gold punch. After the central canal and lateral recesses were well decompressed hemostasis was achieved with the bipolar cautery and some small pledgets of Gelfoam soaked in thrombin that were later irrigated away. Was noted that the posterior longitudinal ligament was disrupted. With some hemorrhagic tissue in this region.  A cortical cancellus manufacture graft was placed into the interspace. This was a 6 mm tall graft cut lordotic fashion. Traction was then removed.  Next the retractor was removed and a 12 millimeter trestle plate was placed over the vertebral bodies and secured with 14 mm variable angle screws. A final localizing radiograph identified the position of the surgical construct. The stasis was achieved in the soft tissues and then the platysma was closed with 3-0 Vicryl in an interrupted fashion and 3-0 Vicryl was used in the subcuticular tissue. Blood loss was estimated at 50 cc

## 2013-04-28 NOTE — Anesthesia Postprocedure Evaluation (Signed)
  Anesthesia Post-op Note  Patient: Zachary Hart  Procedure(s) Performed: Procedure(s): CERVICAL FOUR-FIVE ANTERIOR CERVICAL DECOMPRESSION/DISCECTOMY FUSION 1 LEVEL (N/A)  Patient Location: PACU  Anesthesia Type:General  Level of Consciousness: awake, alert , oriented and patient cooperative  Airway and Oxygen Therapy: Patient Spontanous Breathing  Post-op Pain: mild  Post-op Assessment: Post-op Vital signs reviewed, Patient's Cardiovascular Status Stable, Respiratory Function Stable, Patent Airway, No signs of Nausea or vomiting and Pain level controlled  Post-op Vital Signs: Reviewed and stable  Complications: No apparent anesthesia complications

## 2013-04-29 ENCOUNTER — Encounter (HOSPITAL_COMMUNITY): Payer: Self-pay | Admitting: Neurological Surgery

## 2013-04-29 DIAGNOSIS — S14125A Central cord syndrome at C5 level of cervical spinal cord, initial encounter: Secondary | ICD-10-CM

## 2013-04-29 MED ORDER — CYCLOBENZAPRINE HCL 10 MG PO TABS: 10.0000 mg | ORAL_TABLET | Freq: Three times a day (TID) | ORAL | Status: AC | PRN

## 2013-04-29 MED ORDER — OXYCODONE-ACETAMINOPHEN 5-325 MG PO TABS: 1.0000 | ORAL_TABLET | ORAL | Status: DC | PRN

## 2013-04-29 NOTE — Evaluation (Signed)
Physical Therapy Evaluation Patient Details Name: Zachary Hart MRN: [id number] DOB: 1980/12/20 Today's Date: 04/29/2013 Time: 7829-5621 PT Time Calculation (min): 13 min  PT Assessment / Plan / Recommendation History of Present Illness  Pt adm 9/28 after falling on his head (trying to do a back flip) with resulting C4-5 herniated disk with cord contusion. Pt reported he had paralysis of all 4 extremities for ~10 minutes after incident. Now s/p C4-5 ACDF   Clinical Impression  Patient evaluated by Physical Therapy with no further acute PT needs identified. All education has been completed and the patient has no further questions (neck precautions, sleeping surface/positioning due to pt has a water bed, limiting riding in a car due to risk of accident prior to bone healing/fusing). PT is signing off. Thank you for this referral.     PT Assessment  Patent does not need any further PT services    Follow Up Recommendations  No PT follow up    Does the patient have the potential to tolerate intense rehabilitation      Barriers to Discharge        Equipment Recommendations  None recommended by PT    Recommendations for Other Services     Frequency      Precautions / Restrictions Precautions Precautions: Cervical Precaution Comments: pt able to state 2/3 precautions (OT had educated earlier this morning); able to state no twisting with vc's Required Braces or Orthoses: Cervical Brace Cervical Brace: Hard collar   Pertinent Vitals/Pain Unable to rate; stated he's hurting and wants pain medicine      Mobility  Bed Mobility Bed Mobility: Rolling Right;Right Sidelying to Sit;Sitting - Scoot to [place] of Bed;Sit to Sidelying Right Rolling Right: 6: Modified independent (Device/Increase time) Right Sidelying to Sit: 6: Modified independent (Device/Increase time) Sitting - Scoot to Edge of Bed: 6: Modified independent (Device/Increase time) Sit to Sidelying Right: 6: Modified  independent (Device/Increase time) Details for Bed Mobility Assistance: able to demonstrate correct technique Transfers Transfers: Sit to Stand;Stand to Sit Sit to Stand: 5: Supervision;7: Independent Stand to Sit: 5: Supervision;7: Independent Details for Transfer Assistance: supervision for safety due to moves quickly and chance of dizziness; pt denied dizziness and demonstrated no unsteadiness Ambulation/Gait Ambulation/Gait Assistance: 5: Supervision;7: Independent Ambulation Distance (Feet): 60 Feet Assistive device: None Ambulation/Gait Assistance Details: pt self-limited distance as he "really didn't want to do this"; able to stop, change directions, negotiate obstacles without loss of balance Gait Pattern: Within Functional Limits Stairs: No (pt deferred; able to march in place )    Exercises     PT Diagnosis:    PT Problem List:   PT Treatment Interventions:       PT Goals(Current goals can be found in the care plan section) Acute Rehab PT Goals PT Goal Formulation: No goals set, d/c therapy  Visit Information  Last PT Received On: 04/29/13 Assistance Needed: +1 History of Present Illness: Pt adm 9/28 after falling on his head (trying to do a back flip) with resulting C4-5 herniated disk with cord contusion. Pt reported he had paralysis of all 4 extremities for ~10 minutes after incident. Now s/p C4-5 ACDF       Prior Functioning  Home Living Family/patient expects to be discharged to:: Private residence Living Arrangements: Other relatives Available Help at Discharge: Family (grandmother ) Type of Home: House Home Access: Stairs to enter Secretary/administrator of Steps: 3 Entrance Stairs-Rails: Right;Left;Can reach both Home Layout: One level Prior Function Level of Independence:  Independent Comments: states he is not working Musician: No difficulties    Copywriter, advertising Arousal/Alertness: Awake/alert Behavior During Therapy: Flat  affect;Impulsive Overall Cognitive Status: No family/caregiver present to determine baseline cognitive functioning    Extremity/Trunk Assessment Upper Extremity Assessment Upper Extremity Assessment: Defer to OT evaluation Lower Extremity Assessment Lower Extremity Assessment: Overall WFL for tasks assessed Cervical / Trunk Assessment Cervical / Trunk Assessment: Other exceptions Cervical / Trunk Exceptions: in hard collar; pt required cues not to shake head "yes and no": collar well-fitting and educated pt on keeping his chin in the "cup" of collar   Balance    End of Session PT - End of Session Equipment Utilized During Treatment: Gait belt;Cervical collar Activity Tolerance: Patient tolerated treatment well Patient left: in bed;with call bell/phone within reach;with nursing/sitter in room Nurse Communication: Patient requests pain meds  GP     Zachary Hart 04/29/2013, 10:53 AM Pager [phone]

## 2013-04-29 NOTE — Progress Notes (Addendum)
Occupational Therapy Treatment Patient Details Name: Zachary Hart MRN: [id number] DOB: August 10, 1980 Today's Date: 04/29/2013 Time: 1100-1115 OT Time Calculation (min): 15 min  OT Assessment / Plan / Recommendation  History of present illness Pt adm 9/28 after falling on his head (trying to do a back flip) with resulting C4-5 herniated disk with cord contusion. Pt reported he had paralysis of all 4 extremities for ~10 minutes after incident. Now s/p C4-5 ACDF   OT comments  Session focused on exercises for UE exercises as pt is experiencing weakness and numbness/tingling in hands with left worse than right.  Follow Up Recommendations  No OT follow up;Supervision - Intermittent    Barriers to Discharge       Equipment Recommendations  None recommended by OT    Recommendations for Other Services    Frequency Min 2X/week   Progress towards OT Goals Progress towards OT goals: Progressing toward goals  Plan Discharge plan remains appropriate    Precautions / Restrictions Precautions Precautions: Cervical Precaution Comments: able to state 2/3 precautions Required Braces or Orthoses: Cervical Brace Cervical Brace: Hard collar   Pertinent Vitals/Pain Pain 5/10 in left hand and 8/10 in neck. Nurse notified.     ADL   Equipment Used: Other (comment) (cervical collar); theraputty ADL Comments: Session focused on exercises for hand using theraputty. Also, gave pt a sex handout on positions that maintain cervical precautions as he mentioned this during the evaluation.    OT Diagnosis: Acute pain;Other (comment) (weakness/numbness in hands)  OT Problem List: Decreased strength;Impaired sensation;Decreased cognition;Decreased safety awareness;Decreased knowledge of use of DME or AE;Decreased knowledge of precautions;Impaired UE functional use OT Treatment Interventions: Self-care/ADL training;Therapeutic exercise;DME and/or AE instruction;Therapeutic activities;Patient/family education    OT Goals(current goals can now be found in the care plan section) Acute Rehab OT Goals Patient Stated Goal: wants to go home OT Goal Formulation: With patient Time For Goal Achievement: 05/06/13 Potential to Achieve Goals: Good ADL Goals Pt Will Perform Grooming: with modified independence;standing Pt Will Transfer to Toilet: with modified independence;ambulating;regular height toilet Pt Will Perform Toileting - Clothing Manipulation and hygiene: with modified independence;sit to/from stand Pt/caregiver will Perform Home Exercise Program: Independently;With written HEP provided;Increased strength;With theraputty Additional ADL Goal #1: Pt will be able to independently verbalize and demonstrate 3/3 cervical precautions.   Visit Information  Last OT Received On: 04/29/13 Assistance Needed: +1 History of Present Illness: Pt adm 9/28 after falling on his head (trying to do a back flip) with resulting C4-5 herniated disk with cord contusion. Pt reported he had paralysis of all 4 extremities for ~10 minutes after incident. Now s/p C4-5 ACDF    Subjective Data      Prior Functioning  Home Living Family/patient expects to be discharged to:: Private residence Living Arrangements: Other relatives Available Help at Discharge: Family (grandmother) Type of Home: House Home Access: Stairs to enter Secretary/administrator of Steps: 3 Entrance Stairs-Rails: Right;Left;Can reach both Home Layout: One level Home Equipment: Shower seat Prior Function Level of Independence: Independent Comments: states he is not working Musician: No difficulties Dominant Hand: Left    Cognition  Cognition Arousal/Alertness: Awake/alert Behavior During Therapy: Impulsive Overall Cognitive Status: Within Functional Limits for tasks assessed    Mobility  Bed Mobility Bed Mobility: Rolling Right;Right Sidelying to Sit;Sit to Sidelying Right;Rolling Left Rolling Right: 5:  Supervision Rolling Left: 5: Supervision Right Sidelying to Sit: 6: Modified independent (Device/Increase time) Sit to Sidelying Right: 5: Supervision Details for Bed Mobility Assistance: Cues  to get all the way on side before rolling back. Transfers Transfers: Not assessed    Exercises  Other Exercises Other Exercises: Performed exercises with theraputty   Balance     End of Session OT - End of Session Equipment Utilized During Treatment: Cervical collar Activity Tolerance: Patient tolerated treatment well Patient left: in bed;with call bell/phone within reach Nurse Communication: Pain level  GO     Earlie Raveling OTR/L [phone] 04/29/2013, 1:19 PM
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
## 2013-04-29 NOTE — Discharge Summary (Signed)
Physician Discharge Summary  Patient ID: Zachary Hart MRN: [id number] DOB/AGE: July 06, 1981 32 y.o.  Admit date: 04/27/2013 Discharge date: 04/29/2013  Admission Diagnoses: Central cord injury C4-C5  Discharge Diagnoses: Antral cord injury C4-C5 Active Problems:   * No active hospital problems. *   Discharged Condition: good  Hospital Course: Patient underwent surgical decompression at C4-C5 for a large disc extrusion with cord compression causing a central cord syndrome. Patient had an injury where he did a back flip and landed with the neck flexed position. He tolerated surgery well. Is minimal dysesthesias in the upper extremities with weakness of grips.  Consults: None  Significant Diagnostic Studies: MRI cervical spine  Treatments: surgery: Anterior cervical decompression C4-C5 arthrodesis with structural allograft anterior plate fixation.  Discharge Exam: Blood pressure 157/97, pulse 76, temperature 97.9 F (36.6 C), temperature source Oral, resp. rate 18, height 5\' 7"  (1.702 m), weight 81.058 kg (178 lb 11.2 oz), SpO2 99.00%. Grip strength is 4-5 bilaterally intrinsic strength is 4/5 bilaterally wrist extensors and triceps are 4/5 also. Patient has dysesthetic sensations in his hands. Incision is clean and dry on his neck.  Disposition: 01-Home or Self Care  Discharge Orders   Future Orders Complete By Expires   Call MD for:  redness, tenderness, or signs of infection (pain, swelling, redness, odor or green/yellow discharge around incision site)  As directed    Call MD for:  severe uncontrolled pain  As directed    Call MD for:  temperature >100.4  As directed    Diet - low sodium heart healthy  As directed    Discharge instructions  As directed    Comments:     Okay to shower. Do not apply salves or appointments to incision. No heavy lifting with the upper extremities greater than 15 pounds. May resume driving when not requiring pain medication and patient feels  comfortable with doing so.   Increase activity slowly  As directed        Medication List         cyclobenzaprine 10 MG tablet  Commonly known as:  FLEXERIL  Take 1 tablet (10 mg total) by mouth 3 (three) times daily as needed for muscle spasms.     oxyCODONE-acetaminophen 5-325 MG per tablet  Commonly known as:  ROXICET  Take 1 tablet by mouth every 4 (four) hours as needed for pain.         SignedStefani Dama 04/29/2013, 7:08 PM

## 2013-04-29 NOTE — Progress Notes (Signed)
Patient is requesting to take neck brace off. Patient given morphine 2mg  per PRN order for pain level of 7. Will check if able to adjust neck brace for patient states it is a little tight. Will continue to monitor to end of shift.

## 2013-04-29 NOTE — Progress Notes (Signed)
Pt discharge instruction given and explained prescriptions given  NSL taken out cath intact aspen collar in place. No question asked pt was escorted out with NT and visitors. Jemario Poitras LPN

## 2013-04-29 NOTE — Progress Notes (Signed)
UR COMPLETED  

## 2016-02-05 ENCOUNTER — Emergency Department (HOSPITAL_COMMUNITY)
Admission: EM | Admit: 2016-02-05 | Discharge: 2016-02-05 | Disposition: A | Discharge status: Home / Self Care | Payer: Self-pay | Attending: Emergency Medicine | Admitting: Emergency Medicine

## 2016-02-05 ENCOUNTER — Emergency Department (HOSPITAL_COMMUNITY): Payer: Self-pay

## 2016-02-05 ENCOUNTER — Encounter (HOSPITAL_COMMUNITY): Payer: Self-pay | Admitting: Emergency Medicine

## 2016-02-05 DIAGNOSIS — W3400XA Accidental discharge from unspecified firearms or gun, initial encounter: Secondary | ICD-10-CM | POA: Insufficient documentation

## 2016-02-05 DIAGNOSIS — Y999 Unspecified external cause status: Secondary | ICD-10-CM | POA: Insufficient documentation

## 2016-02-05 DIAGNOSIS — J45909 Unspecified asthma, uncomplicated: Secondary | ICD-10-CM | POA: Insufficient documentation

## 2016-02-05 DIAGNOSIS — Y939 Activity, unspecified: Secondary | ICD-10-CM | POA: Insufficient documentation

## 2016-02-05 DIAGNOSIS — Y929 Unspecified place or not applicable: Secondary | ICD-10-CM | POA: Insufficient documentation

## 2016-02-05 DIAGNOSIS — F17200 Nicotine dependence, unspecified, uncomplicated: Secondary | ICD-10-CM | POA: Insufficient documentation

## 2016-02-05 DIAGNOSIS — S81832A Puncture wound without foreign body, left lower leg, initial encounter: Secondary | ICD-10-CM | POA: Insufficient documentation

## 2016-02-05 LAB — BASIC METABOLIC PANEL
Anion gap: 12 (ref 5–15)
BUN: 8 mg/dL (ref 6–20)
CALCIUM: 8.8 mg/dL — AB (ref 8.9–10.3)
CO2: 20 mmol/L — ABNORMAL LOW (ref 22–32)
CREATININE: 1.05 mg/dL (ref 0.61–1.24)
Chloride: 106 mmol/L (ref 101–111)
GFR calc Af Amer: >60 mL/min (ref >60)
GFR calc non Af Amer: >60 mL/min (ref >60)
Glucose, Bld: 103 mg/dL — ABNORMAL HIGH (ref 65–99)
Potassium: 3.6 mmol/L (ref 3.5–5.1)
SODIUM: 138 mmol/L (ref 135–145)

## 2016-02-05 LAB — CBC WITH DIFFERENTIAL/PLATELET
Basophils Absolute: 0 10*3/uL (ref 0.0–0.1)
Basophils Relative: 0 %
EOS ABS: 0 10*3/uL (ref 0.0–0.7)
Eosinophils Relative: 0 %
HCT: 38.6 % — ABNORMAL LOW (ref 39.0–52.0)
Hemoglobin: 12.8 g/dL — ABNORMAL LOW (ref 13.0–17.0)
Lymphocytes Relative: 54 %
Lymphs Abs: 2.6 10*3/uL (ref 0.7–4.0)
MCH: 29.8 pg (ref 26.0–34.0)
MCHC: 33.2 g/dL (ref 30.0–36.0)
MCV: 89.8 fL (ref 78.0–100.0)
MONO ABS: 0.3 10*3/uL (ref 0.1–1.0)
MONOS PCT: 5 %
NEUTROS PCT: 41 %
Neutro Abs: 2 10*3/uL (ref 1.7–7.7)
Platelets: 285 10*3/uL (ref 150–400)
RBC: 4.3 MIL/uL (ref 4.22–5.81)
RDW: 12.5 % (ref 11.5–15.5)
WBC: 4.8 10*3/uL (ref 4.0–10.5)

## 2016-02-05 MED ORDER — OXYCODONE-ACETAMINOPHEN 5-325 MG PO TABS: 1.0000 | ORAL_TABLET | ORAL | Status: AC | PRN

## 2016-02-05 MED ORDER — MORPHINE SULFATE (PF) 2 MG/ML IV SOLN
2.0000 mg | Freq: Once | INTRAVENOUS | Status: AC
  Administered 2016-02-05: 2 mg via INTRAVENOUS
  Filled 2016-02-05: qty 1

## 2016-02-05 MED ORDER — CEFAZOLIN IN D5W 1 GM/50ML IV SOLN
1.0000 g | Freq: Once | INTRAVENOUS | Status: AC
  Administered 2016-02-05: 1 g via INTRAVENOUS
  Filled 2016-02-05: qty 50

## 2016-02-05 MED ORDER — CEPHALEXIN 500 MG PO CAPS: 500.0000 mg | ORAL_CAPSULE | Freq: Four times a day (QID) | ORAL | Status: AC

## 2016-02-05 MED ORDER — OXYCODONE-ACETAMINOPHEN 5-325 MG PO TABS
2.0000 | ORAL_TABLET | Freq: Once | ORAL | Status: AC
  Administered 2016-02-05: 2 via ORAL
  Filled 2016-02-05: qty 2
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
## 2016-02-05 NOTE — Progress Notes (Signed)
Orthopedic Tech Progress Note Patient Details:  Zachary RothmanStephen A Hart February 21, 1981 [id number]  Ortho Devices Type of Ortho Device: Knee Immobilizer, Crutches Ortho Device/Splint Location: LLE Ortho Device/Splint Interventions: Ordered, Application, Adjustment   Jennye MoccasinHughes, [name] 02/05/2016, 3:15 PM
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
## 2016-02-05 NOTE — ED Notes (Signed)
Patient transported to X-ray 

## 2016-02-05 NOTE — ED Notes (Signed)
Family at bedside, given code of [phone]
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
## 2016-07-06 ENCOUNTER — Encounter (HOSPITAL_COMMUNITY): Payer: Self-pay

## 2016-07-06 ENCOUNTER — Emergency Department (HOSPITAL_COMMUNITY)
Admission: EM | Admit: 2016-07-06 | Discharge: 2016-07-06 | Disposition: A | Discharge status: Home / Self Care | Payer: Self-pay | Attending: Emergency Medicine | Admitting: Emergency Medicine

## 2016-07-06 ENCOUNTER — Emergency Department (HOSPITAL_COMMUNITY): Payer: Self-pay

## 2016-07-06 DIAGNOSIS — F17200 Nicotine dependence, unspecified, uncomplicated: Secondary | ICD-10-CM | POA: Insufficient documentation

## 2016-07-06 DIAGNOSIS — R091 Pleurisy: Secondary | ICD-10-CM | POA: Insufficient documentation

## 2016-07-06 DIAGNOSIS — J45909 Unspecified asthma, uncomplicated: Secondary | ICD-10-CM | POA: Insufficient documentation

## 2016-07-06 DIAGNOSIS — R079 Chest pain, unspecified: Secondary | ICD-10-CM

## 2016-07-06 LAB — BASIC METABOLIC PANEL
Anion gap: 8 (ref 5–15)
BUN: 12 mg/dL (ref 6–20)
CALCIUM: 9.1 mg/dL (ref 8.9–10.3)
CO2: 23 mmol/L (ref 22–32)
CREATININE: 1.05 mg/dL (ref 0.61–1.24)
Chloride: 106 mmol/L (ref 101–111)
GFR calc Af Amer: >60 mL/min (ref >60)
GLUCOSE: 86 mg/dL (ref 65–99)
Potassium: 4.3 mmol/L (ref 3.5–5.1)
Sodium: 137 mmol/L (ref 135–145)

## 2016-07-06 LAB — I-STAT TROPONIN, ED: TROPONIN I, POC: 0 ng/mL (ref 0.00–0.08)

## 2016-07-06 LAB — CBC
HCT: 42.9 % (ref 39.0–52.0)
HEMOGLOBIN: 14.3 g/dL (ref 13.0–17.0)
MCH: 29.4 pg (ref 26.0–34.0)
MCHC: 33.3 g/dL (ref 30.0–36.0)
MCV: 88.3 fL (ref 78.0–100.0)
PLATELETS: 259 10*3/uL (ref 150–400)
RBC: 4.86 MIL/uL (ref 4.22–5.81)
RDW: 12.2 % (ref 11.5–15.5)
WBC: 6.4 10*3/uL (ref 4.0–10.5)

## 2016-07-06 MED ORDER — OXYCODONE-ACETAMINOPHEN 5-325 MG PO TABS
1.0000 | ORAL_TABLET | Freq: Once | ORAL | Status: AC
  Administered 2016-07-06: 1 via ORAL
  Filled 2016-07-06: qty 1

## 2016-07-06 MED ORDER — KETOROLAC TROMETHAMINE 60 MG/2ML IM SOLN
60.0000 mg | Freq: Once | INTRAMUSCULAR | Status: AC
  Administered 2016-07-06: 60 mg via INTRAMUSCULAR
  Filled 2016-07-06: qty 2

## 2016-07-06 MED ORDER — IBUPROFEN 600 MG PO TABS: 600.0000 mg | ORAL_TABLET | Freq: Three times a day (TID) | ORAL | 0 refills | Status: AC | PRN

## 2016-07-06 MED ORDER — PREDNISONE 20 MG PO TABS
40.0000 mg | ORAL_TABLET | Freq: Every day | ORAL | 0 refills | Status: AC
Start: 2016-07-06 — End: ?

## 2016-07-06 NOTE — ED Triage Notes (Signed)
Pt. Coming from home via POV for chest pain that started upon waking ~1hr ago. Pt. Denies hx of same. Pt. States pain is central chest and radiates to his back. Pt.describes the pain as sharp. Pt. Also endorses SOB.

## 2016-07-06 NOTE — ED Notes (Signed)
Patient transported to X-ray 

## 2016-07-06 NOTE — ED Notes (Signed)
EDP at bedside  

## 2016-07-06 NOTE — ED Provider Notes (Signed)
[facility]-EMERGENCY DEPT Provider Note   CSN: [id number] Arrival date & time: 07/06/16  [id number]     History   Chief Complaint Chief Complaint  Patient presents with  . Chest Pain    HPI Zachary Hart is a 35 y.o. male.  HPI Patient presents the emergency department today with anterior pleuritic chest pain which began today.  He did not have any issues in the past several days.  No recent cough or congestion.  Denies fevers and chills.  Reports no significant shortness of breath.  No history DVT or pulmonary embolism.  Denies unilateral leg swelling.  No recent travel or surgery.  Denies productive cough.  Continues to smoke cigarettes.  No significant past medical history per the patient.  He states it hurts when he takes deep breath it hurts in his right anterior chest. His moderate in severity    Past Medical History:  Diagnosis Date  . Asthma     Patient Active Problem List   Diagnosis Date Noted  . 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 ANTERIOR CERVICAL DECOMP/DISCECTOMY FUSION N/A 04/28/2013   Procedure: CERVICAL FOUR-FIVE ANTERIOR CERVICAL DECOMPRESSION/DISCECTOMY FUSION 1 LEVEL;  Surgeon: Barnett AbuHenry Elsner, MD;  Location: [facility];  Service: Neurosurgery;  Laterality: N/A;       Home Medications    Prior to Admission medications   Medication Sig Start Date End Date Taking? Authorizing Provider  cephALEXin (KEFLEX) 500 MG capsule Take 1 capsule (500 mg total) by mouth 4 (four) times daily. 02/05/16   Margarita Grizzleanielle Ray, MD  cyclobenzaprine (FLEXERIL) 10 MG tablet Take 1 tablet (10 mg total) by mouth 3 (three) times daily as needed for muscle spasms. 04/29/13   Barnett AbuHenry Elsner, MD  ibuprofen (ADVIL,MOTRIN) 600 MG tablet Take 1 tablet (600 mg total) by mouth every 8 (eight) hours as needed. 07/06/16   Azalia BilisKevin Sheilia Reznick, MD  oxyCODONE-acetaminophen (PERCOCET/ROXICET) 5-325 MG tablet  Take 1 tablet by mouth every 4 (four) hours as needed for severe pain. 02/05/16   Margarita Grizzleanielle Ray, MD    Family History History reviewed. No pertinent family history.  Social History Social History  Substance Use Topics  . Smoking status: Current Every Day Smoker    Packs/day: 0.50  . Smokeless tobacco: Never Used  . Alcohol use Yes     Comment: occasional     Allergies   Patient has no known allergies.   Review of Systems Review of Systems  All other systems reviewed and are negative.    Physical Exam Updated Vital Signs BP 158/95 (BP Location: Right Arm)   Pulse 70   Temp 98 F (36.7 C) (Oral)   Resp 13   Ht 5\' 7"  (1.702 m)   Wt 185 lb (83.9 kg)   SpO2 100%   BMI 28.98 kg/m   Physical Exam  Constitutional: He is oriented to person, place, and time. He appears well-developed and well-nourished.  HENT:  Head: Normocephalic and atraumatic.  Eyes: EOM are normal.  Neck: Normal range of motion.  Cardiovascular: Normal rate, regular rhythm, normal heart sounds and intact distal pulses.   Pulmonary/Chest: Effort normal and breath sounds normal. No respiratory distress.  Abdominal: Soft. He exhibits no distension. There is no tenderness.  Musculoskeletal: Normal range of motion.  Neurological: He is alert and oriented to person, place, and time.  Skin: Skin is warm and dry.  Psychiatric: He has  a normal mood and affect. Judgment normal.  Nursing note and vitals reviewed.    ED Treatments / Results  Labs (all labs ordered are listed, but only abnormal results are displayed) Labs Reviewed  BASIC METABOLIC PANEL  CBC  I-STAT TROPOININ, ED    EKG  EKG Interpretation  Date/Time:  Thursday July 06 2016 06:59:40 EST Ventricular Rate:  73 PR Interval:    QRS Duration: 88 QT Interval:  368 QTC Calculation: 406 R Axis:   45 Text Interpretation:  Sinus rhythm ST elev, probable normal early repol pattern No significant change was found Confirmed by Saleah Rishel  MD,  Diogenes Whirley ([id number]) on 07/06/2016 8:45:23 AM       Radiology Dg Chest 2 View  Result Date: 07/06/2016 CLINICAL DATA:  Chest pain EXAM: CHEST  2 VIEW COMPARISON:  None. FINDINGS: The heart size and mediastinal contours are within normal limits. Both lungs are clear. The visualized skeletal structures are unremarkable. IMPRESSION: No active cardiopulmonary disease. Electronically Signed   By: Charlett NoseKevin  Dover M.D.   On: 07/06/2016 07:59    Procedures Procedures (including critical care time)  Medications Ordered in ED Medications  ketorolac (TORADOL) injection 60 mg (60 mg Intramuscular Given 07/06/16 0801)  oxyCODONE-acetaminophen (PERCOCET/ROXICET) 5-325 MG per tablet 1 tablet (1 tablet Oral Given 07/06/16 0801)     Initial Impression / Assessment and Plan / ED Course  I have reviewed the triage vital signs and the nursing notes.  Pertinent labs & imaging results that were available during my care of the patient were reviewed by me and considered in my medical decision making (see chart for details).  Clinical Course     Overall well-appearing.  Suspect pleurisy.  Patient be discharged home with a course of anti-inflammatories and prednisone.  Primary care follow-up.  He understands return to the ER for new or worsening symptoms.  Patient is PERC negative  Final Clinical Impressions(s) / ED Diagnoses   Final diagnoses:  Chest pain, unspecified type  Pleurisy    New Prescriptions New Prescriptions   IBUPROFEN (ADVIL,MOTRIN) 600 MG TABLET    Take 1 tablet (600 mg total) by mouth every 8 (eight) hours as needed.     Azalia BilisKevin Zanylah Hardie, MD 07/06/16 [phone]
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
## 2020-01-20 ENCOUNTER — Emergency Department (HOSPITAL_COMMUNITY): Admission: EM | Admit: 2020-01-20 | Discharge: 2020-01-20 | Discharge status: Against Medical Advice | Payer: Self-pay
